# Patient Record
Sex: Male | Born: 1965 | Race: White | Hispanic: No | Marital: Married | Smoking: Never smoker
Health system: Southern US, Community
[De-identification: ages and names within clinical notes are randomized; demographics above are authoritative.]

## PROBLEM LIST (undated history)

## (undated) HISTORY — PX: BACK SURGERY: SHX140

## (undated) HISTORY — PX: KNEE SURGERY: SHX244

---

## 2014-04-21 ENCOUNTER — Other Ambulatory Visit: Payer: Self-pay | Admitting: Nurse Practitioner

## 2014-04-21 ENCOUNTER — Ambulatory Visit
Admission: RE | Admit: 2014-04-21 | Discharge: 2014-04-21 | Disposition: A | Discharge status: Home / Self Care | Payer: PRIVATE HEALTH INSURANCE | Source: Ambulatory Visit | Attending: Nurse Practitioner | Admitting: Nurse Practitioner

## 2014-04-21 ENCOUNTER — Other Ambulatory Visit: Payer: Self-pay

## 2014-04-21 DIAGNOSIS — M549 Dorsalgia, unspecified: Secondary | ICD-10-CM

## 2014-04-22 ENCOUNTER — Other Ambulatory Visit: Payer: Self-pay

## 2014-04-26 ENCOUNTER — Other Ambulatory Visit: Payer: Self-pay | Admitting: Neurological Surgery

## 2014-04-26 DIAGNOSIS — M545 Low back pain, unspecified: Secondary | ICD-10-CM

## 2014-04-26 DIAGNOSIS — M5430 Sciatica, unspecified side: Secondary | ICD-10-CM

## 2014-04-28 ENCOUNTER — Ambulatory Visit
Admission: RE | Admit: 2014-04-28 | Discharge: 2014-04-28 | Disposition: A | Discharge status: Home / Self Care | Payer: PRIVATE HEALTH INSURANCE | Source: Ambulatory Visit | Attending: Neurological Surgery | Admitting: Neurological Surgery

## 2014-04-28 VITALS — BP 155/81 | HR 71

## 2014-04-28 DIAGNOSIS — M545 Low back pain, unspecified: Secondary | ICD-10-CM

## 2014-04-28 DIAGNOSIS — M5430 Sciatica, unspecified side: Secondary | ICD-10-CM

## 2014-04-28 MED ORDER — METHYLPREDNISOLONE ACETATE 40 MG/ML INJ SUSP (RADIOLOG
120.0000 mg | Freq: Once | INTRAMUSCULAR | Status: AC
  Administered 2014-04-28: 120 mg via EPIDURAL

## 2014-04-28 MED ORDER — IOHEXOL 180 MG/ML  SOLN
1.0000 mL | Freq: Once | INTRAMUSCULAR | Status: AC | PRN
  Administered 2014-04-28: 1 mL via EPIDURAL

## 2014-04-28 NOTE — Discharge Instructions (Signed)

## 2014-06-22 ENCOUNTER — Other Ambulatory Visit: Payer: Self-pay | Admitting: Neurological Surgery

## 2014-06-22 DIAGNOSIS — M545 Low back pain, unspecified: Secondary | ICD-10-CM

## 2014-07-07 ENCOUNTER — Other Ambulatory Visit: Payer: PRIVATE HEALTH INSURANCE

## 2014-07-07 ENCOUNTER — Ambulatory Visit
Admission: RE | Admit: 2014-07-07 | Discharge: 2014-07-07 | Disposition: A | Discharge status: Home / Self Care | Payer: PRIVATE HEALTH INSURANCE | Source: Ambulatory Visit | Attending: Neurological Surgery | Admitting: Neurological Surgery

## 2014-07-07 ENCOUNTER — Other Ambulatory Visit: Payer: Self-pay | Admitting: Neurological Surgery

## 2014-07-07 DIAGNOSIS — M545 Low back pain, unspecified: Secondary | ICD-10-CM

## 2014-07-07 MED ORDER — IOHEXOL 180 MG/ML  SOLN
1.0000 mL | Freq: Once | INTRAMUSCULAR | Status: AC | PRN
  Administered 2014-07-07: 1 mL via EPIDURAL

## 2014-07-07 MED ORDER — METHYLPREDNISOLONE ACETATE 40 MG/ML INJ SUSP (RADIOLOG
120.0000 mg | Freq: Once | INTRAMUSCULAR | Status: AC
  Administered 2014-07-07: 120 mg via EPIDURAL

## 2015-02-01 ENCOUNTER — Ambulatory Visit
Admission: RE | Admit: 2015-02-01 | Discharge: 2015-02-01 | Disposition: A | Discharge status: Home / Self Care | Payer: PRIVATE HEALTH INSURANCE | Source: Ambulatory Visit | Attending: Neurological Surgery | Admitting: Neurological Surgery

## 2015-02-01 ENCOUNTER — Other Ambulatory Visit: Payer: Self-pay | Admitting: Neurological Surgery

## 2015-02-01 DIAGNOSIS — M542 Cervicalgia: Secondary | ICD-10-CM

## 2015-02-01 MED ORDER — GADOBENATE DIMEGLUMINE 529 MG/ML IV SOLN: 17.0000 mL | Freq: Once | INTRAVENOUS | Status: AC | PRN

## 2015-02-13 ENCOUNTER — Emergency Department (HOSPITAL_COMMUNITY)
Admission: EM | Admit: 2015-02-13 | Discharge: 2015-02-13 | Disposition: A | Discharge status: Home / Self Care | Payer: PRIVATE HEALTH INSURANCE | Attending: Emergency Medicine | Admitting: Emergency Medicine

## 2015-02-13 ENCOUNTER — Ambulatory Visit
Admission: RE | Admit: 2015-02-13 | Discharge: 2015-02-13 | Disposition: A | Discharge status: Home / Self Care | Payer: PRIVATE HEALTH INSURANCE | Source: Ambulatory Visit | Attending: Neurological Surgery | Admitting: Neurological Surgery

## 2015-02-13 ENCOUNTER — Other Ambulatory Visit: Payer: Self-pay | Admitting: Neurological Surgery

## 2015-02-13 ENCOUNTER — Emergency Department (HOSPITAL_COMMUNITY): Payer: PRIVATE HEALTH INSURANCE

## 2015-02-13 ENCOUNTER — Encounter (HOSPITAL_COMMUNITY): Payer: Self-pay | Admitting: Nurse Practitioner

## 2015-02-13 DIAGNOSIS — M5116 Intervertebral disc disorders with radiculopathy, lumbar region: Secondary | ICD-10-CM | POA: Insufficient documentation

## 2015-02-13 DIAGNOSIS — Z79899 Other long term (current) drug therapy: Secondary | ICD-10-CM | POA: Insufficient documentation

## 2015-02-13 DIAGNOSIS — M5136 Other intervertebral disc degeneration, lumbar region: Secondary | ICD-10-CM

## 2015-02-13 DIAGNOSIS — M5126 Other intervertebral disc displacement, lumbar region: Secondary | ICD-10-CM

## 2015-02-13 DIAGNOSIS — R52 Pain, unspecified: Secondary | ICD-10-CM

## 2015-02-13 DIAGNOSIS — Z791 Long term (current) use of non-steroidal anti-inflammatories (NSAID): Secondary | ICD-10-CM | POA: Insufficient documentation

## 2015-02-13 DIAGNOSIS — M545 Low back pain: Secondary | ICD-10-CM | POA: Diagnosis present

## 2015-02-13 MED ORDER — FENTANYL CITRATE 0.05 MG/ML IJ SOLN
100.0000 ug | Freq: Once | INTRAMUSCULAR | Status: AC
  Administered 2015-02-13: 100 ug via INTRAVENOUS
  Filled 2015-02-13: qty 2

## 2015-02-13 MED ORDER — FENTANYL CITRATE 0.05 MG/ML IJ SOLN
200.0000 ug | Freq: Once | INTRAMUSCULAR | Status: AC
  Administered 2015-02-13: 200 ug via INTRAVENOUS
  Filled 2015-02-13: qty 4

## 2015-02-13 MED ORDER — ONDANSETRON HCL 4 MG/2ML IJ SOLN
4.0000 mg | Freq: Once | INTRAMUSCULAR | Status: AC
  Administered 2015-02-13: 4 mg via INTRAVENOUS
  Filled 2015-02-13: qty 2

## 2015-02-13 MED ORDER — OXYCODONE-ACETAMINOPHEN 5-325 MG PO TABS: 1.0000 | ORAL_TABLET | ORAL | Status: DC | PRN

## 2015-02-13 MED ORDER — METHYLPREDNISOLONE ACETATE 40 MG/ML INJ SUSP (RADIOLOG
120.0000 mg | Freq: Once | INTRAMUSCULAR | Status: AC
  Administered 2015-02-13: 120 mg via EPIDURAL

## 2015-02-13 MED ORDER — FENTANYL CITRATE 0.05 MG/ML IJ SOLN: 100.0000 ug | INTRAMUSCULAR | Status: DC | PRN

## 2015-02-13 MED ORDER — DEXAMETHASONE SODIUM PHOSPHATE 10 MG/ML IJ SOLN
10.0000 mg | Freq: Once | INTRAMUSCULAR | Status: AC
  Administered 2015-02-13: 10 mg via INTRAVENOUS
  Filled 2015-02-13: qty 1

## 2015-02-13 MED ORDER — FENTANYL CITRATE 0.05 MG/ML IJ SOLN
100.0000 ug | Freq: Once | INTRAMUSCULAR | Status: AC
Start: 2015-02-13 — End: 2015-02-13
  Administered 2015-02-13: 100 ug via INTRAVENOUS
  Filled 2015-02-13: qty 2

## 2015-02-13 MED ORDER — IOHEXOL 180 MG/ML  SOLN
1.0000 mL | Freq: Once | INTRAMUSCULAR | Status: AC | PRN
  Administered 2015-02-13: 1 mL via EPIDURAL

## 2015-02-13 NOTE — ED Notes (Signed)
[facility] Expected date:  Expected time:  Means of arrival:  Comments: Back pain

## 2015-02-13 NOTE — ED Notes (Signed)
Patient transported to MRI 

## 2015-02-13 NOTE — ED Notes (Signed)
Pt presents via EMS who report pt c/o back pain, pt remarks on a hx of what he states as back problems, injury to lumbar sacral area L4-L5 with a bulging disk, 2 weeks ago he had an injury to C4-C5 while on vacation where he ended up with upper extremity numbness. Pt received 250 mcg Fentanyl from EMS en route, rating pain 9/10.

## 2015-02-13 NOTE — ED Notes (Signed)
Ice pack and heat pack given for alternating.

## 2015-02-13 NOTE — ED Provider Notes (Signed)
CSN: [id number]     Arrival date & time 02/13/15  0122 History   First MD Initiated Contact with Patient 02/13/15 [phone]     Chief Complaint  Patient presents with  . Back Pain     (Consider location/radiation/quality/duration/timing/severity/associated sxs/prior Treatment) HPI  This is a 49 year old male with a history of degenerative neck and lower back disease. He has had a right lumbar steroid injection in the past with good results. He is currently being treated by Dr. Yetta Barre for cervical radiculopathy for which he takes Vicodin. He is here with pain in his left lumbar region which began yesterday morning after lifting. The pain is severe, sharp and radiates down the back of his leg with associated numbness and paresthesias. He has also had some difficulty urinating but is not sure if this is due to pain. The pain is exacerbated by movement of the left leg at the hip. He is having difficulty ambulating or bearing weight due to the pain. He denies saddle anesthesia. He was given 250 micrograms of fentanyl IV by EMS prior to arrival with partial relief of the pain.  History reviewed. No pertinent past medical history. Past Surgical History  Procedure Laterality Date  . Knee surgery     History reviewed. No pertinent family history. History  Substance Use Topics  . Smoking status: Never Smoker   . Smokeless tobacco: Never Used  . Alcohol Use: No    Review of Systems  All other systems reviewed and are negative.   Allergies  Morphine and related  Home Medications   Prior to Admission medications   Medication Sig Start Date End Date Taking? Authorizing Provider  diclofenac (CATAFLAM) 50 MG tablet Take 50 mg by mouth 2 (two) times daily.   Yes Historical Provider, MD  escitalopram (LEXAPRO) 20 MG tablet Take 20 mg by mouth daily.   Yes Historical Provider, MD  HYDROcodone-acetaminophen (NORCO/VICODIN) 5-325 MG per tablet Take 1 tablet by mouth every 6 (six) hours as needed for  moderate pain.   Yes Historical Provider, MD  ranitidine (ZANTAC) 300 MG capsule Take 300 mg by mouth every evening.   Yes Historical Provider, MD  simvastatin (ZOCOR) 20 MG tablet Take 20 mg by mouth daily.   Yes Historical Provider, MD   BP 116/69 mmHg  Pulse 83  Temp(Src) 98.3 F (36.8 C)  Resp 24  SpO2 95%   Physical Exam  General: Well-developed, well-nourished male in no acute distress; appearance consistent with age of record HENT: normocephalic; atraumatic Eyes: Normal appearance Neck: supple Heart: regular rate and rhythm Lungs: clear to auscultation bilaterally Abdomen: soft; nondistended Back: Left paralumbar tenderness; severe pain on attempted left straight leg raise Extremities: No deformity; full range of motion; pulses normal Neurologic: Awake, alert and oriented; motor function intact in all extremities except left lower extremity exam limited by pain; sensation intact bilaterally except for decreased sensation in the left lower extremity, notably the calf; no facial droop; no saddle anesthesia Skin: Warm and dry Psychiatric: Flat affect    ED Course  Procedures (including critical care time)   MDM  5:04 AM We'll obtain MRI later this morning.  Nursing notes and vitals signs, including pulse oximetry, reviewed.  Summary of this visit's results, reviewed by myself:  Imaging Studies: Mr Lumbar Spine Wo Contrast  02/13/2015   CLINICAL DATA:  Lifting effacing and felt a pop in the back. Severe lower back pain radiating to the left leg and groin.  EXAM: MRI LUMBAR SPINE WITHOUT  CONTRAST  TECHNIQUE: Multiplanar, multisequence MR imaging of the lumbar spine was performed. No intravenous contrast was administered.  COMPARISON:  04/21/2014  FINDINGS: No marrow signal abnormality suggestive of fracture, infection, or neoplasm. Normal conus signal and morphology. No perispinal abnormality to explain back pain.  Degenerative changes:  T12- L1: Unremarkable.  L1-L2:  Unremarkable.  L2-L3: Unremarkable.  L3-L4: New left foraminal disc herniation, likely recent given the mildly hyperintense T2 signal, which deforms and posteriorly displaces the left L3 nerve.  L4-L5: Chronic right foraminal and far lateral disc protrusion with deflection but no compression of the nerve.  L5-S1:Unremarkable.  IMPRESSION: 1. 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 The herniation deforms the left L3 nerve and is the most likely explanation for the left radicular symptoms. 2. L4-5 right foraminal and far-lateral disc protrusion without change.   Electronically Signed   By: Marnee SpringJonathon  Watts M.D.   On: 02/13/2015 07:28   7:43 AM Patient's pain now  6/10, down from 10/10 on arrival. Advised of MRI findings. He will follow up with his neurosurgeon, Dr. Yetta BarreJones.     Paula LibraJohn Levii Hairfield, MD 02/13/15 [id number]

## 2015-02-13 NOTE — Discharge Instructions (Signed)

## 2015-02-13 NOTE — ED Notes (Signed)
Heat applied. Patient states his left leg is numb.

## 2015-02-13 NOTE — ED Notes (Signed)
Patient states he has hx of bulging L4-5 disc and a recent injury to his C5-6. Patient is under the care of Dr Yetta BarreJones for both these issues. Patient states his pain is currently 10/10, that he was able to receive some relief with the fentanyl given by EMS. Patient reports in the past he was treated with LESI to the Right L4, but now his pain is mainly isolated to the left. Patient states the pain intensified suddently when he got up to use the restroom early this am. Patient taking 5/325 Vicodin for pain, scheduled to be taken Q6 hours, patient states he is taking them Q4 hours.

## 2015-07-20 IMAGING — CR DG SACRUM/COCCYX 2+V
2 series · 2 of 2 positions shown · non-contrast
Comparison: None.

CLINICAL DATA: 48-year-old male with low back pain after twisting
injury. Initial encounter.

EXAM:
SACRUM AND COCCYX - 2+ VIEW

[t sacrum a.p.]
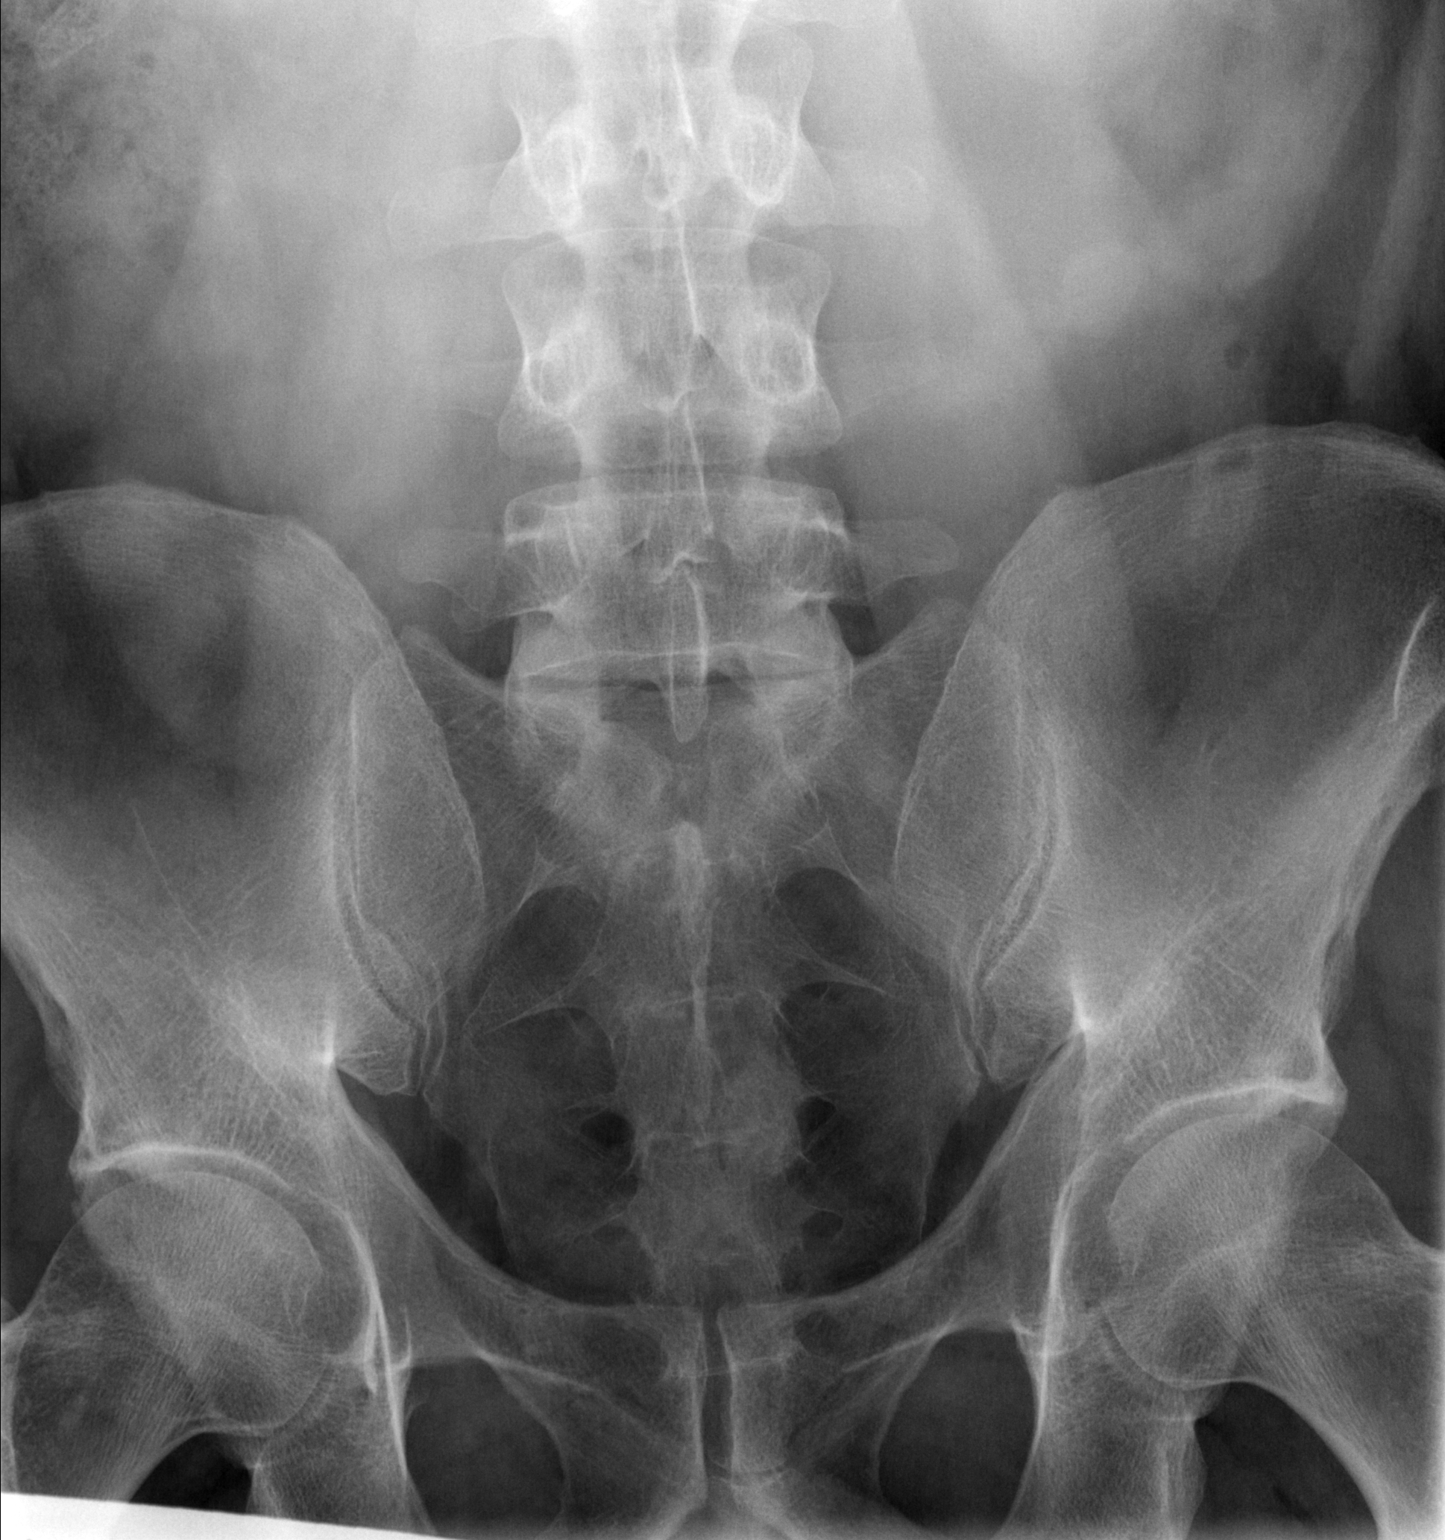

[t sacrum lat]
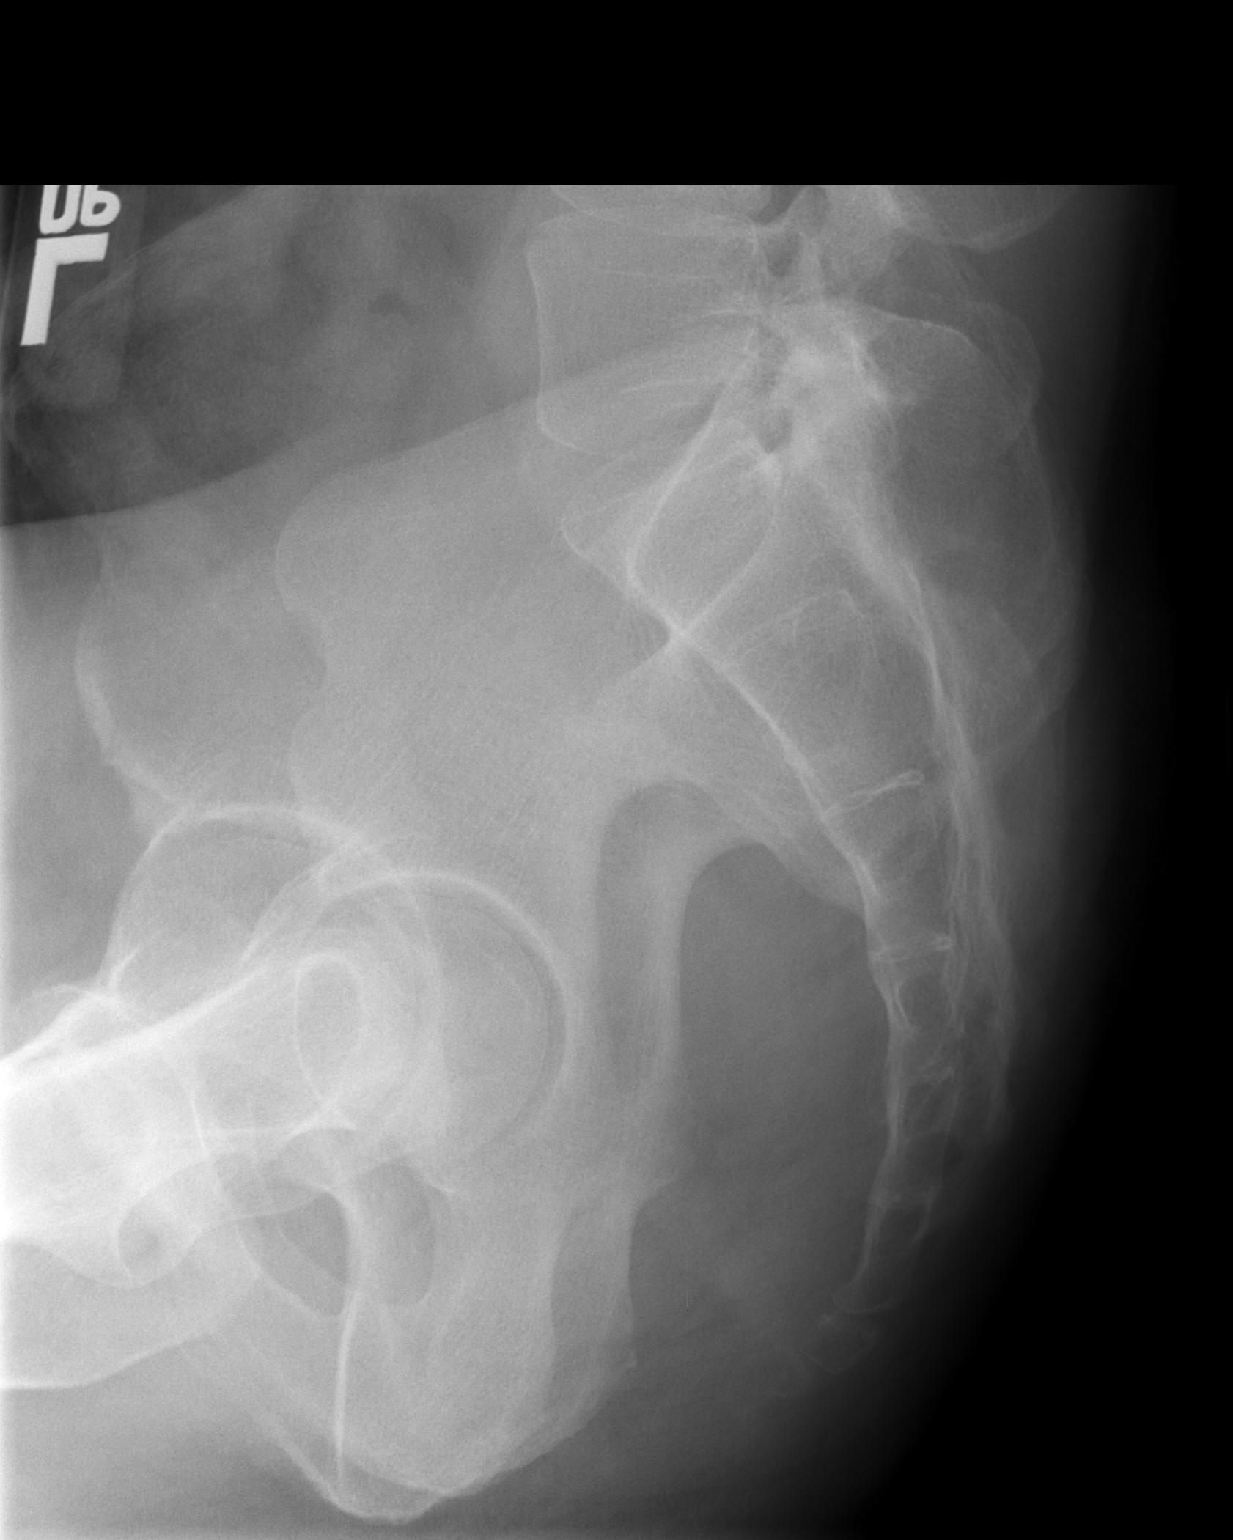

[2 of 2 positions shown; findings below may reference images not displayed]

FINDINGS: Bone mineralization is within normal limits. Sacrum intact. SI
joints appear normal. Coccygeal segments appear intact. Visible
pelvis and lower lumbar spine appear intact.
IMPRESSION: No acute fracture or dislocation identified about the sacrum or
coccyx. .

## 2015-08-22 ENCOUNTER — Other Ambulatory Visit: Payer: Self-pay | Admitting: Gastroenterology

## 2015-10-31 ENCOUNTER — Encounter (HOSPITAL_COMMUNITY): Payer: Self-pay | Admitting: *Deleted

## 2015-11-13 ENCOUNTER — Encounter (HOSPITAL_COMMUNITY)
Admission: RE | Disposition: A | Discharge status: Home / Self Care | Payer: Self-pay | Source: Ambulatory Visit | Attending: Gastroenterology

## 2015-11-13 ENCOUNTER — Encounter (HOSPITAL_COMMUNITY): Payer: Self-pay | Admitting: *Deleted

## 2015-11-13 ENCOUNTER — Ambulatory Visit (HOSPITAL_COMMUNITY): Payer: PRIVATE HEALTH INSURANCE | Admitting: Certified Registered Nurse Anesthetist

## 2015-11-13 ENCOUNTER — Ambulatory Visit (HOSPITAL_COMMUNITY)
Admission: RE | Admit: 2015-11-13 | Discharge: 2015-11-13 | Disposition: A | Discharge status: Home / Self Care | Payer: PRIVATE HEALTH INSURANCE | Source: Ambulatory Visit | Attending: Gastroenterology | Admitting: Gastroenterology

## 2015-11-13 DIAGNOSIS — Z1211 Encounter for screening for malignant neoplasm of colon: Secondary | ICD-10-CM | POA: Diagnosis present

## 2015-11-13 DIAGNOSIS — E7800 Pure hypercholesterolemia, unspecified: Secondary | ICD-10-CM | POA: Diagnosis not present

## 2015-11-13 HISTORY — PX: COLONOSCOPY WITH PROPOFOL: SHX5780

## 2015-11-13 SURGERY — COLONOSCOPY WITH PROPOFOL
Anesthesia: Monitor Anesthesia Care

## 2015-11-13 MED ORDER — LACTATED RINGERS IV SOLN
INTRAVENOUS | Status: DC
  Administered 2015-11-13: 1000 mL via INTRAVENOUS

## 2015-11-13 MED ORDER — PROPOFOL 10 MG/ML IV BOLUS
INTRAVENOUS | Status: DC | PRN
  Administered 2015-11-13: 50 mg via INTRAVENOUS
  Administered 2015-11-13: 100 mg via INTRAVENOUS
  Administered 2015-11-13 (×3): 50 mg via INTRAVENOUS

## 2015-11-13 MED ORDER — PROPOFOL 10 MG/ML IV BOLUS
INTRAVENOUS | Status: AC
  Filled 2015-11-13: qty 20

## 2015-11-13 MED ORDER — SODIUM CHLORIDE 0.9 % IV SOLN: INTRAVENOUS | Status: DC

## 2015-11-13 SURGICAL SUPPLY — 21 items

## 2015-11-13 NOTE — Anesthesia Preprocedure Evaluation (Addendum)
Anesthesia Evaluation  Patient identified by MRN, date of birth, ID band Patient awake    Reviewed: Allergy & Precautions, NPO status , Patient's Chart, lab work & pertinent test results, reviewed documented beta blocker date and time   Airway Mallampati: II  TM Distance: >3 FB Neck ROM: Full    Dental no notable dental hx.    Pulmonary neg pulmonary ROS,    Pulmonary exam normal breath sounds clear to auscultation       Cardiovascular Pt. on home beta blockers Normal cardiovascular exam Rhythm:Regular Rate:Normal     Neuro/Psych negative neurological ROS  negative psych ROS   GI/Hepatic negative GI ROS, Neg liver ROS,   Endo/Other  negative endocrine ROS  Renal/GU negative Renal ROS     Musculoskeletal negative musculoskeletal ROS (+)   Abdominal   Peds  Hematology negative hematology ROS (+)   Anesthesia Other Findings   Reproductive/Obstetrics                             Anesthesia Physical Anesthesia Plan  ASA: I  Anesthesia Plan: MAC   Post-op Pain Management:    Induction: Intravenous  Airway Management Planned:   Additional Equipment:   Intra-op Plan:   Post-operative Plan:   Informed Consent: I have reviewed the patients History and Physical, chart, labs and discussed the procedure including the risks, benefits and alternatives for the proposed anesthesia with the patient or authorized representative who has indicated his/her understanding and acceptance.   Dental advisory given  Plan Discussed with: CRNA  Anesthesia Plan Comments:        Anesthesia Quick Evaluation

## 2015-11-13 NOTE — Transfer of Care (Signed)
Immediate Anesthesia Transfer of Care Note  Patient: Lynnell DikeRobert Madariaga  Procedure(s) Performed: Procedure(s): COLONOSCOPY WITH PROPOFOL (N/A)  Patient Location: PACU  Anesthesia Type:MAC  Level of Consciousness: awake, alert  and oriented  Airway & Oxygen Therapy: Patient Spontanous Breathing and Patient connected to face mask oxygen  Post-op Assessment: Report given to RN and Post -op Vital signs reviewed and stable  Post vital signs: Reviewed and stable  Last Vitals:  Filed Vitals:   11/13/15 0814  BP: 134/76  Pulse: 68  Temp: 36.7 C  Resp: 17    Complications: No apparent anesthesia complications

## 2015-11-13 NOTE — H&P (Signed)
  Procedure: Baseline screening colonoscopy  History: The patient is a 49 year old male born [birth year]. He is scheduled to undergo his first screening colonoscopy with polypectomy to prevent colon cancer.  Medication allergies: Morphine sulfate  Past medical history: Cervical disc disease. Lumbar disc surgery. Chronic anxiety. Hypercholesterolemia. Arthroscopic left knee surgery.  Family history: Mother died of lung cancer  Exam: The patient is alert and lying comfortably on the endoscopy stretcher. Abdomen is soft and nontender to palpation. Cardiac exam reveals a regular rhythm. Lungs are clear to auscultation.  Plan: Proceed with screening colonoscopy

## 2015-11-13 NOTE — Anesthesia Postprocedure Evaluation (Signed)
Anesthesia Post Note  Patient: Andre DikeRobert Patel  Procedure(s) Performed: Procedure(s) (LRB): COLONOSCOPY WITH PROPOFOL (N/A)  Patient location during evaluation: PACU Anesthesia Type: MAC Level of consciousness: awake and alert Pain management: pain level controlled Vital Signs Assessment: post-procedure vital signs reviewed and stable Respiratory status: spontaneous breathing Cardiovascular status: stable Anesthetic complications: no    Last Vitals:  Filed Vitals:   11/13/15 0950 11/13/15 0955  BP: 123/73   Pulse: 58 57  Temp:    Resp: 16 18    Last Pain: There were no vitals filed for this visit.               Lewie LoronJohn Sorah Falkenstein

## 2015-11-13 NOTE — Discharge Instructions (Signed)

## 2015-11-13 NOTE — Op Note (Signed)
Procedure: Baseline screening colonoscopy  Endoscopist: Danise EdgeMartin Sirinity Outland  Premedication: Propofol administered by anesthesia  Procedure: The patient was placed in the left lateral decubitus position. Anal inspection and digital rectal exam were normal. The Pentax pediatric colonoscope was introduced into the rectum and advanced to the cecum. A normal-appearing appendiceal orifice and ileocecal valve were identified. Colonic preparation for the exam today was good. Withdrawal time was 10 minutes  Rectum. Normal. Retroflexed view of the distal rectum was normal  Sigmoid colon and descending colon. Normal  Splenic flexure. Normal  Transverse colon. Normal  Hepatic flexure. Normal  Ascending colon. Normal  Cecum and ileocecal valve. Normal  Assessment: Normal screening colonoscopy  Recommendation: Schedule repeat screening colonoscopy in 10 years

## 2015-11-14 ENCOUNTER — Encounter (HOSPITAL_COMMUNITY): Payer: Self-pay | Admitting: Gastroenterology

## 2016-10-27 ENCOUNTER — Ambulatory Visit
Admission: RE | Admit: 2016-10-27 | Discharge: 2016-10-27 | Disposition: A | Discharge status: Home / Self Care | Payer: PRIVATE HEALTH INSURANCE | Source: Ambulatory Visit | Attending: Internal Medicine | Admitting: Internal Medicine

## 2016-10-27 ENCOUNTER — Other Ambulatory Visit: Payer: Self-pay | Admitting: Internal Medicine

## 2016-10-27 DIAGNOSIS — M79671 Pain in right foot: Secondary | ICD-10-CM
